# Patient Record
Sex: Male | Born: 1975 | Race: Black or African American | Hispanic: No | Marital: Married | Smoking: Never smoker
Health system: Southern US, Community
[De-identification: ages and names within clinical notes are randomized; demographics above are authoritative.]

---

## 2006-07-26 HISTORY — PX: VASECTOMY: SHX75

## 2007-07-27 ENCOUNTER — Emergency Department (HOSPITAL_COMMUNITY): Admission: EM | Admit: 2007-07-27 | Discharge: 2007-07-28 | Payer: Self-pay | Admitting: Emergency Medicine

## 2008-07-26 HISTORY — PX: OTHER SURGICAL HISTORY: SHX169

## 2009-11-19 ENCOUNTER — Ambulatory Visit: Payer: Self-pay | Admitting: Internal Medicine

## 2009-11-19 DIAGNOSIS — M549 Dorsalgia, unspecified: Secondary | ICD-10-CM | POA: Insufficient documentation

## 2009-11-19 DIAGNOSIS — IMO0002 Reserved for concepts with insufficient information to code with codable children: Secondary | ICD-10-CM

## 2009-11-20 ENCOUNTER — Encounter: Payer: Self-pay | Admitting: Internal Medicine

## 2009-11-21 ENCOUNTER — Telehealth: Payer: Self-pay | Admitting: Internal Medicine

## 2009-12-03 ENCOUNTER — Ambulatory Visit: Payer: Self-pay | Admitting: Internal Medicine

## 2010-08-25 NOTE — Letter (Signed)
Summary: Results Follow-up Letter  [facility]  [address] [place], [place] [address]   Phone: [phone]  Fax: [phone]    11/20/2009  [address] [place], [place]  [address]  Dear Mr. TIMBERMAN,   The following are the results of your recent test(s):  Test       Result     Xray of low back     mild arthritis   _________________________________________________________  Please call for an appointment as directed _________________________________________________________ _________________________________________________________ _________________________________________________________  Sincerely,  Sanda Linger MD [facility]

## 2010-08-25 NOTE — Assessment & Plan Note (Signed)
Summary: 2 wk f/u // #/cd   Vital Signs:  Patient profile:   35 year old male Height:      69 inches Weight:      240 pounds BMI:     35.57 O2 Sat:      98 % on Room air Temp:     99.0 degrees F oral Pulse rate:   68 / minute Pulse rhythm:   regular Resp:     16 per minute BP sitting:   118 / 74  (left arm) Cuff size:   large  Vitals Entered By: Rock Nephew CMA (Dec 03, 2009 9:23 AM)  Nutrition Counseling: Patient's BMI is greater than 25 and therefore counseled on weight management options.  O2 Flow:  Room air CC: follow-up visit// discuss medication and reaction, Back Pain Is Patient Diabetic? No   Primary Care Provider:  Etta Grandchild MD  CC:  follow-up visit// discuss medication and reaction and Back Pain.  History of Present Illness: He returns for f/up on LBP. It has improved some and is isolated to his LB with no radiation but he does have some "weird" numbness and tingling in the back of both thighs but none down to the feet. The request for an MRI was denied by his insurance company. He wants to try some PT. He has stopped taking Nucynta b/c it caused a rash. Naprosyn is controlling his pain.  Back Pain History:      He states this is not work related.  He states that he has no prior history of back pain.  The patient has not had any recent physical therapy for his back pain.    Critical Exclusionary Diagnosis Criteria (CEDC) for Back Pain:      The patient denies a history of previous trauma.  He has no prior history of spinal surgery.  There are no symptoms to suggest infection, cancer, cauda equina, or psychosocial factors for back pain.     Current Medications (verified): 1)  Naprosyn 375 Mg Tabs (Naproxen) .... One By Mouth Two Times A Day As Needed For Low Back Pain 2)  Nucynta 75 Mg Tabs (Tapentadol Hcl) .... One By Mouth Qid As Needed For Severe Pain 3)  Men Multivitamin  Allergies (verified): 1)  ! * Nucynta  Past History:  Past Medical  History: Reviewed history from 11/19/2009 and no changes required. Unremarkable  Past Surgical History: Reviewed history from 11/19/2009 and no changes required. Denies surgical history  Family History: Reviewed history from 11/19/2009 and no changes required. Family History of Alcoholism/Addiction Family History of Arthritis Family History Diabetes 1st degree relative Family History Hypertension  Social History: Reviewed history from 11/19/2009 and no changes required. Occupation: Public librarian Married Never Smoked Alcohol use-yes Drug use-no Regular exercise-yes  Review of Systems  The patient denies anorexia, fever, weight loss, chest pain, prolonged cough, headaches, hemoptysis, abdominal pain, incontinence, muscle weakness, and difficulty walking.    Physical Exam  General:  alert, well-developed, well-nourished, well-hydrated, appropriate dress, normal appearance, healthy-appearing, and cooperative to examination.   Mouth:  Oral mucosa and oropharynx without lesions or exudates.  Teeth in good repair. Neck:  No deformities, masses, or tenderness noted. Lungs:  Normal respiratory effort, chest expands symmetrically. Lungs are clear to auscultation, no crackles or wheezes. Heart:  Normal rate and regular rhythm. S1 and S2 normal without gallop, murmur, click, rub or other extra sounds. Abdomen:  Bowel sounds positive,abdomen soft and non-tender without masses, organomegaly or hernias noted. Msk:  normal ROM, no joint tenderness, no joint swelling, no joint warmth, no redness over joints, no joint deformities, no joint instability, no crepitation, and no muscle atrophy.   Pulses:  R and L carotid,radial,femoral,dorsalis pedis and posterior tibial pulses are full and equal bilaterally Extremities:  No clubbing, cyanosis, edema, or deformity noted with normal full range of motion of all joints.   Neurologic:  alert & oriented X3, cranial nerves II-XII intact, strength normal in  all extremities, and gait normal.   Skin:  turgor normal, color normal, no rashes, no suspicious lesions, no ecchymoses, no petechiae, no purpura, no ulcerations, and no edema.    Low Back Pain Physical Exam:    Inspection-deformity:     No    Palpation-spinal tenderness:   No    Motor Exam/Strength:         Left Ankle Dorsiflexion (L5,L4):     normal       Left Great Toe Dorsiflexion (L5,L4):     normal       Left Heel Walk (L5,some L4):     normal       Left Single Squat & Rise-Quads (L4):   normal       Left Toe Walk-calf (S1):       normal       Right Ankle Dorsiflexion (L5,L4):     normal       Right Great Toe Dorsiflexion (L5,L4):       normal       Right Heel Walk (L5,some L4):     normal       Right Single Squat & Rise Quads (L4):   normal       Right Toe Walk-calf (S1):       normal    Reflexes:        Left Knee Jerk (L4):     decreased       Left Ankle Reflex (S1):   decreased       Right Knee Jerk:     decreased       Right Ankle Reflex (S1):   decreased    Straight Leg Raise (SLR):       Left Straight Leg Raise (SLR):   negative       Right Straight Leg Raise (SLR):   negative   Impression & Recommendations:  Problem # 1:  LUMBAR RADICULOPATHY (ICD-724.4) Assessment Improved  The following medications were removed from the medication list:    Nucynta 75 Mg Tabs (Tapentadol hcl) ..... One by mouth qid as needed for severe pain His updated medication list for this problem includes:    Naprosyn 375 Mg Tabs (Naproxen) ..... One by mouth two times a day as needed for low back pain  Orders: Physical Therapy Referral (PT)  Problem # 2:  BACK PAIN (ICD-724.5) Assessment: Improved  The following medications were removed from the medication list:    Nucynta 75 Mg Tabs (Tapentadol hcl) ..... One by mouth qid as needed for severe pain His updated medication list for this problem includes:    Naprosyn 375 Mg Tabs (Naproxen) ..... One by mouth two times a day as needed for  low back pain  Orders: Physical Therapy Referral (PT)  Complete Medication List: 1)  Naprosyn 375 Mg Tabs (Naproxen) .... One by mouth two times a day as needed for low back pain 2)  Men Multivitamin   Patient Instructions: 1)  Please schedule a follow-up appointment in 2 months. 2)  Most patients (90%)  with low back pain will improve with time (2-6 weeks). Keep active but avoid activities that are painful. Apply moist heat and/or ice to lower back several times a day.

## 2010-08-25 NOTE — Assessment & Plan Note (Signed)
Summary: NEW/ [employer]/ BACK PROBLEM/NWS  #   Vital Signs:  Patient profile:   35 year old male Height:      69 inches Weight:      241.75 pounds BMI:     35.83 O2 Sat:      96 % on Room air Temp:     98.6 degrees F oral Pulse rate:   64 / minute Pulse rhythm:   regular Resp:     16 per minute BP sitting:   140 / 82  (left arm) Cuff size:   large  Vitals Entered By: Rock Nephew CMA (November 19, 2009 3:57 PM)  Nutrition Counseling: Patient's BMI is greater than 25 and therefore counseled on weight management options.  O2 Flow:  Room air CC: Low back pain w/ Right side foot numbness x 66month, Back pain, Back Pain Is Patient Diabetic? No Pain Assessment Patient in pain? yes     Location: back   Primary Care Provider:  Etta Grandchild MD  CC:  Low back pain w/ Right side foot numbness x 66month, Back pain, and Back Pain.  History of Present Illness:  Back Pain      This is a 35 year old man who presents with Back pain.  The symptoms began 4-8 weeks ago.  The intensity is described as moderate.  The patient denies fever, chills, weakness, loss of sensation, fecal incontinence, urinary incontinence, urinary retention, dysuria, rest pain, inability to work, and inability to care for self.  The pain is located in the mid low back.  The pain began suddenly.  The pain radiates to the right leg below the knee and left buttock.  Risk factors for serious underlying conditions include duration of pain > 1 month.     Critical Exclusionary Diagnosis Criteria (CEDC) for Back Pain:      The patient denies a history of previous trauma.  He notes a prior history of spinal surgery.  There are no symptoms to suggest infection, cauda equina, or psychosocial factors for back pain.  Cancer risk factors include no improvement in low back pain after 4-6 weeks therapy.  Other positive CEDC factors include progressive neurologic symptoms.     Preventive Screening-Counseling &  Management  Alcohol-Tobacco     Alcohol drinks/day: <1     Alcohol type: beer     >5/day in last 3 mos: no     Alcohol Counseling: not indicated; use of alcohol is not excessive or problematic     Feels need to cut down: no     Feels annoyed by complaints: no     Feels guilty re: drinking: no     Needs 'eye opener' in am: no     Smoking Status: never  Caffeine-Diet-Exercise     Does Patient Exercise: yes  Hep-HIV-STD-Contraception     Hepatitis Risk: no risk noted     HIV Risk: no risk noted     STD Risk: no risk noted     TSE monthly: yes     Testicular SE Education/Counseling to perform regular STE      Sexual History:  currently monogamous.        Drug Use:  no.        Blood Transfusions:  no.    Medications Prior to Update: 1)  None  Current Medications (verified): 1)  Naprosyn 375 Mg Tabs (Naproxen) .... One By Mouth Two Times A Day As Needed For Low Back Pain 2)  Nucynta 75 Mg  Tabs (Tapentadol Hcl) .... 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  The patient denies anorexia, fever, weight loss, chest pain, syncope, dyspnea on exertion, peripheral edema, prolonged cough, headaches, hemoptysis, abdominal pain, hematuria, incontinence, muscle weakness, suspicious  skin lesions, and enlarged lymph nodes.    Physical Exam  General:  alert, well-developed, well-nourished, well-hydrated, appropriate dress, normal appearance, healthy-appearing, and cooperative to examination.   Head:  normocephalic, atraumatic, no abnormalities observed, and no abnormalities palpated.   Eyes:  vision grossly intact, pupils equal, pupils round, and pupils reactive to light.   Mouth:  Oral mucosa and oropharynx without lesions or exudates.  Teeth in good repair. Neck:  No deformities, masses, or tenderness noted. Lungs:  Normal respiratory effort, chest expands symmetrically. Lungs are clear to auscultation, no crackles or wheezes. Heart:  Normal rate and regular rhythm. S1 and S2 normal without gallop, murmur, click, rub or other extra sounds. Abdomen:  Bowel sounds positive,abdomen soft and non-tender without masses, organomegaly or hernias noted. Msk:  normal ROM, no joint tenderness, no joint swelling, no joint warmth, no redness over joints, no joint deformities, no joint instability, no crepitation, and no muscle atrophy.   Pulses:  R and L carotid,radial,femoral,dorsalis pedis and posterior tibial pulses are full and equal bilaterally Extremities:  No clubbing, cyanosis, edema, or deformity noted with normal full range of motion of all joints.   Neurologic:  alert & oriented X3, cranial nerves II-XII intact, strength normal in all extremities, and gait normal.   Skin:  turgor normal, color normal, no rashes, no suspicious lesions, no ecchymoses, no petechiae, no purpura, no ulcerations, and no edema.   Cervical Nodes:  no anterior cervical adenopathy and no posterior cervical adenopathy.   Axillary Nodes:  no R axillary adenopathy and no L axillary adenopathy.   Inguinal Nodes:  no R inguinal adenopathy and no L inguinal adenopathy.    Low Back Pain Physical Exam:    Inspection-deformity:     No    Palpation-spinal tenderness:   No    Motor Exam/Strength:         Left  Ankle Dorsiflexion (L5,L4):     normal       Left Great Toe Dorsiflexion (L5,L4):     normal       Left Heel Walk (L5,some L4):     normal       Left Single Squat & Rise-Quads (L4):   normal       Left Toe Walk-calf (S1):       normal       Right Ankle Dorsiflexion (L5,L4):     normal       Right Great Toe Dorsiflexion (L5,L4):       normal       Right Heel Walk (L5,some L4):     normal  Right Single Squat & Rise Quads (L4):   normal       Right Toe Walk-calf (S1):       normal    Sensory Exam/Pinprick:        Left Medial Foot (L4):   normal       Left Dorsal Foot (L5):   normal       Right Medial Foot (L4):   normal       Right Dorsal Foot (L5):   normal       Right Lateral Foot (S1):   normal    Reflexes:        Left Knee Jerk (L4):     decreased       Left Ankle Reflex (S1):   decreased       Right Knee Jerk:     decreased       Right Ankle Reflex (S1):   decreased    Straight Leg Raise (SLR):       Left Straight Leg Raise (SLR):   positive at 10 degrees       Right Straight Leg Raise (SLR):   positive at 5 degrees   Impression & Recommendations:  Problem # 1:  LUMBAR RADICULOPATHY (ICD-724.4) Assessment New  His updated medication list for this problem includes:    Naprosyn 375 Mg Tabs (Naproxen) ..... One by mouth two times a day as needed for low back pain    Nucynta 75 Mg Tabs (Tapentadol hcl) ..... One by mouth qid as needed for severe pain  Orders: Radiology Referral (Radiology)  Discussed use of moist heat or ice, modified activities, medications, and stretching/strengthening exercises. Back care instructions given. To be seen in 2 weeks if no improvement; sooner if worsening of symptoms.   Problem # 2:  BACK PAIN (ICD-724.5) Assessment: New  His updated medication list for this problem includes:    Naprosyn 375 Mg Tabs (Naproxen) ..... One by mouth two times a day as needed for low back pain    Nucynta 75 Mg Tabs (Tapentadol hcl) ..... One by mouth qid as  needed for severe pain  Orders: T-Lumbar Spine Complete, 5 Views [phone])  Discussed use of moist heat or ice, modified activities, medications, and stretching/strengthening exercises. Back care instructions given. To be seen in 2 weeks if no improvement; sooner if worsening of symptoms.   Complete Medication List: 1)  Naprosyn 375 Mg Tabs (Naproxen) .... One by mouth two times a day as needed for low back pain 2)  Nucynta 75 Mg Tabs (Tapentadol hcl) .... One by mouth qid as needed for severe pain  Indications for MRI of L/S Spine Based on Back Pain History and Exam: 1)   no improvement in LBP after 4-6 wks Rx 2)   Sciatica (pain radiates below knee) 3)   progressive neurologic sx's  Patient Instructions: 1)  Please schedule a follow-up appointment in 2 weeks. 2)  Take 650-1000mg  of Tylenol every 4-6 hours as needed for relief of pain or comfort of fever AVOID taking more than 4000mg   in a 24 hour period (can cause liver damage in higher doses). 3)  Most patients (90%) with low back pain will improve with time (2-6 weeks). Keep active but avoid activities that are painful. Apply moist heat and/or ice to lower back several times a day. Prescriptions: NUCYNTA 75 MG TABS (TAPENTADOL HCL) One by mouth QID as needed for severe pain  #75 x 0   Entered and Authorized by:   Maisie Fus  Karsten Ro MD   Signed by:   Etta Grandchild MD on 11/19/2009   Method used:   Print then Give to Patient   RxID:   [id number] NAPROSYN 375 MG TABS (NAPROXEN) One by mouth two times a day as needed for low back pain  #60 x 1   Entered and Authorized by:   Etta Grandchild MD   Signed by:   Etta Grandchild MD on 11/19/2009   Method used:   Print then Give to Patient   RxID:   [id number]

## 2010-08-25 NOTE — Progress Notes (Signed)
     Follow-up for Phone Call       Follow-up by: Etta Grandchild MD,  November 21, 2009 11:36 AM

## 2013-08-02 ENCOUNTER — Emergency Department (INDEPENDENT_AMBULATORY_CARE_PROVIDER_SITE_OTHER)
Admission: EM | Admit: 2013-08-02 | Discharge: 2013-08-02 | Disposition: A | Discharge status: Home / Self Care | Source: Home / Self Care

## 2013-08-02 ENCOUNTER — Encounter (HOSPITAL_COMMUNITY): Payer: Self-pay | Admitting: Emergency Medicine

## 2013-08-02 DIAGNOSIS — M778 Other enthesopathies, not elsewhere classified: Secondary | ICD-10-CM

## 2013-08-02 DIAGNOSIS — M779 Enthesopathy, unspecified: Principal | ICD-10-CM

## 2013-08-02 DIAGNOSIS — M65839 Other synovitis and tenosynovitis, unspecified forearm: Secondary | ICD-10-CM

## 2013-08-02 DIAGNOSIS — M65849 Other synovitis and tenosynovitis, unspecified hand: Secondary | ICD-10-CM

## 2013-08-02 NOTE — ED Provider Notes (Signed)
Medical screening examination/treatment/procedure(s) were performed by non-physician practitioner and as supervising physician I was immediately available for consultation/collaboration.  Kadajah Kjos, M.D.  Frank Pilger C Sanaiyah Kirchhoff, MD 08/02/13 1530 

## 2013-08-02 NOTE — ED Notes (Signed)
Pt  Reports   Pain  r  Thumb    X  4  Days          denys  Any injury         He              Reports  Certain       Movements  And  posistions  Cause  Pain         -  He denys  Any other     Symptoms  He  Is  Awake  And  Alert  And  Oriented

## 2013-08-02 NOTE — Discharge Instructions (Signed)
Extensor Pollicis Longus Tendinitis °Extensor pollicis longus (EPL) tendonitis is a condition in which the EPL tendon lining (sheath) becomes inflamed. This causes pain on the thumb side (radial side) of the back of the wrist. The EPL tendon attaches the EPL muscle to the bone. The EPL muscle straightens the thumb. The tendon lining secretes a lubricant that allows the EPL to glide smoothly. When the lining becomes inflamed, the tendon can not glide smoothly, which causes pain. There are three grades of EPL tendonitis. A grade 1 strain is a mild strain, where the tendon experiences a slight pull, but no obvious tearing (microscopic tearing). There is no loss of strength, and the tendon is the correct length. Grade 2 strains are a moderate strain. There is tearing of tendon fibers within the tendon or where the tendon meets the bone or muscle. Tearing of the tendon causes the length of the whole muscle-tendon-bone unit to increase. A grade 2 injury usually results in decreased strength. A grade 3 strain is a complete rupture of the tendon.  °CAUSES  °· EPL tendinitis is often an overuse injury and caused by repeated motions of the hand and wrist, due to friction of the tendon within the lining. °· Sudden increase in activity or change in activity. °SYMPTOMS  °· Vague, diffuse pain and tenderness over the thumb side of the back of the wrist. °· Pain that gets worse when straightening the thumb. °· Locking, catching, or triggering of the thumb joint. °· Limited motion of the thumb. °· Little or no swelling, warmth, or redness of the back of the wrist. °· Crackling sound (crepitation) when the tendon or wrist is moved or touched. °RISK INCREASES WITH: °· Sports that involve repeated hand and wrist motions (tennis, golf, bowling). °· Previous wrist injury. °· Heavy labor. °· Poor wrist strength and flexibility. °· Failure to warm up properly before activity. °PREVENTION  °· Warm up and stretch properly before  activity. °· Allow time for adequate rest and recovery between practices and competition. °· Maintain physical fitness: °· Forearm, wrist, and hand flexibility. °· Muscle strength and endurance. °· Learn and use proper exercise technique. °PROGNOSIS  °This condition is often curable within 6 weeks, if treated properly with non-surgical treatment and resting the affected area. Surgery may be advised. °RELATED COMPLICATIONS  °· Longer healing time, if not properly treated, or if not given adequate time to heal. °· Chronic inflammation of the EPL tendon, causing pain. °· Recurring of symptoms, especially if activity is resumed too soon. °· Risks of surgery: infection, bleeding, injury to nerves (numbness of the thumb), continued pain, incomplete release of the tendon lining, recurring symptoms, cutting of the tendon, thumb weakness, thumb stiffness, and inability to straighten the thumb. °TREATMENT  °Treatment first involves ice and medicine to reduce pain and inflammation. Stretching and strengthening exercises, along with activity modification, may be advised to reduce painful symptoms. For certain cases, a brace, splint, or taping may be advised, to reduce wrist movement during activity. Your caregiver may recommend a corticosteroid injection to reduce inflammation. If non-surgical treatment is unsuccessful, surgery may be advised to release the inflamed tendon. If the tendon is torn, it may require repair or replacement (reconstruction). °MEDICATION  °· If pain medicine is needed, nonsteroidal anti-inflammatory medicines, (aspirin and ibuprofen), or other minor pain relievers (acetaminophen), are often advised. °· Do not take pain medicine for 7 days before surgery. °· Prescription pain relievers are usually only prescribed after surgery. Use only as directed and only   as much as you need. °· Cortisone injections reduce inflammation. However, these are used only in extreme cases, because there is a limit to the  number of times they may be given. These injections may weaken muscle and tendon tissue. Anesthetics also temporarily relieve pain. °COLD THERAPY  °Cold treatment (icing) relieves pain and reduces inflammation. Cold treatment should be applied for 10 to 15 minutes every 2 to 3 hours, and immediately after activity that aggravates your symptoms. Use ice packs or an ice massage. °SEEK MEDICAL CARE IF:  °· Symptoms get worse or do not improve in 2 to 4 weeks, despite treatment. °· You experience pain, numbness, or coldness in the hand. °· Blue, gray, or dark color appears in the fingernails. °· Any of the following occur after surgery: increased pain, swelling, redness, drainage of fluids, bleeding in the affected area, or signs of infection. °· New, unexplained symptoms develop. (Drugs used in treatment may produce side effects.) °Document Released: 07/12/2005 Document Revised: 10/04/2011 Document Reviewed: 10/24/2008 °[employer]® Patient Information ©2014 [employer]. ° °Tendinitis °Tendinitis is swelling and inflammation of the tendons. Tendons are band-like tissues that connect muscle to bone. Tendinitis commonly occurs in the:  °· Shoulders (rotator cuff). °· Heels (Achilles tendon). °· Elbows (triceps tendon). °CAUSES °Tendinitis is usually caused by overusing the tendon, muscles, and joints involved. When the tissue surrounding a tendon (synovium) becomes inflamed, it is called tenosynovitis. Tendinitis commonly develops in people whose jobs require repetitive motions. °SYMPTOMS °· Pain. °· Tenderness. °· Mild swelling. °DIAGNOSIS °Tendinitis is usually diagnosed by physical exam. Your caregiver may also order X-rays or other imaging tests. °TREATMENT °Your caregiver may recommend certain medicines or exercises for your treatment. °HOME CARE INSTRUCTIONS  °· Use a sling or splint for as long as directed by your caregiver until the pain decreases. °· Put ice on the injured area. °· Put ice in a plastic  bag. °· Place a towel between your skin and the bag. °· Leave the ice on for 15-20 minutes, 03-04 times a day. °· Avoid using the limb while the tendon is painful. Perform gentle range of motion exercises only as directed by your caregiver. Stop exercises if pain or discomfort increase, unless directed otherwise by your caregiver. °· Only take over-the-counter or prescription medicines for pain, discomfort, or fever as directed by your caregiver. °SEEK MEDICAL CARE IF:  °· Your pain and swelling increase. °· You develop new, unexplained symptoms, especially increased numbness in the hands. °MAKE SURE YOU:  °· Understand these instructions. °· Will watch your condition. °· Will get help right away if you are not doing well or get worse. °Document Released: 07/09/2000 Document Revised: 10/04/2011 Document Reviewed: 09/28/2010 °[employer]® Patient Information ©2014 [employer]. ° °Repetitive Strain Injuries °Repetitive strain injuries (RSIs) result from overuse or misuse of soft tissues including muscles, tendons, or nerves. Tendons are the cord-like structures that attach muscles to bones. RSIs can affect almost any part of the body. However, RSIs are most common in the arms (thumbs, wrists, elbows, shoulders) and legs (ankles, knees). Common medical conditions that are often caused by repetitive strain include carpal tunnel syndrome, tennis or golfer's elbow, bursitis, and tendonitis. If RSIs are treated early, and the repeated activity is reduced or removed, the severity and length of your problems can usually be reduced. RSIs are also called cumulative trauma disorders (CTD).  °CAUSES  °Many RSIs occur due to repeating the same activity at work over weeks or months without sufficient rest, such as prolonged typing.   RSIs also commonly occur when a hobby or sport is done repeatedly without sufficient rest. RSIs can also occur due to repeated strain or stress on a body part in someone who has one or more risk factors  for RSIs. °RISK FACTORS °Workplace risk factors °· Frequent computer use, especially if your workstation is not adjusted for your body type. °· Infrequent rest breaks. °· Working in a high-pressure environment. °· Working at a fast pace. °· Repeating the same motion, such as frequent typing. °· Working in an awkward position or holding the same position for a long time. °· Forceful movements such as lifting, pulling, or pushing. °· Vibration caused by using power tools. °· Working in cold temperatures. °· Job stress. °Personal risk factors °· Poor posture. °· Being loose-jointed. °· Not exercising regularly. °· Being overweight. °· Arthritis, diabetes, thyroid problems, or other long-term (chronic) medical conditions. °· Vitamin deficiencies. °· Keeping your fingernails long. °· An unhealthy, stressful, or inactive lifestyle. °· Not sleeping well. °SYMPTOMS  °Symptoms often begin at work but become more noticeable after the repeated stress has ended. For example, you may develop fatigue or soreness in your  wrist while typing at work, and at night you may develop numbness and tingling in your fingers. Common symptoms include:  °· Burning, shooting, or aching pain, especially in the fingers, palms, wrists, forearms, or shoulders. °· Tenderness. °· Swelling. °· Tingling, numbness, or loss of feeling. °· Pain with certain activities, such as turning a doorknob or reaching above your head. °· Weakness, heaviness, or loss of coordination in your hand. °· Muscle spasms or tightness. °In some cases, symptoms can become so intense that it is difficult to perform everyday tasks. Symptoms that do not improve with rest may indicate a more serious condition.  °DIAGNOSIS  °Your caregiver may determine the type of RSI you have based on your medical evaluation and a description of your activities.  °TREATMENT  °Treatment depends on the severity and type of RSI you have. Your caregiver may recommend rest for the affected body part,  medicines, and physical or occupational therapy to reduce pain, swelling, and soreness. Discuss the activities you do repeatedly with your caregiver. Your caregiver can help you decide whether you need to change your activities. An RSI may take months or years to heal, especially if the affected body part gets insufficient rest. In some cases, such as severe carpal tunnel syndrome, surgery may be recommended. °PREVENTION °· Talk with your supervisor to make sure you have the proper equipment for your work station. °· Maintain good posture at your desk or work station with: °· Feet flat on the floor. °· Knees directly over the feet, bent at a right angle. °· Lower back supported by your chair or a cushion in the curve of your lower back. °· Shoulders and arms relaxed and at your sides. °· Neck relaxed and not bent forwards or backwards. °· Your desk and computer workstation properly adjusted to your body type. °· Your chair adjusted so there is no excess pressure on the back of your thighs. °· The keyboard resting above your thighs. You should be able to reach the keys with your elbows at your side, bent at a right angle. Your arms should be supported on forearm rests, with your forearms parallel to the ground. °· The computer mouse within easy reach. °· The monitor directly in front of you, so that your eyes are aligned with the top of the screen. The screen should be about 15 to 25 inches   from your eyes. °· While typing, keep your wrist straight, in a neutral position. Move your entire arm when you move your mouse or when typing hard-to-reach keys. °· Only use your computer as much as you need to for work. Do not use it during breaks. °· Take breaks often from any repeated activity. Alternate with another task which requires you to use different muscles, or rest at least once every hour. °· Change positions regularly. If you spend a lot of time sitting, get up, walk around, and stretch. °· Do not hold pens or  pencils tightly when writing. °· Exercise regularly. °· Maintain a normal weight. °· Eat a diet with plenty of vegetables, whole grains, and fruit. °· Get sufficient, restful sleep. °HOME CARE INSTRUCTIONS °· If your caregiver prescribed medicine to help reduce swelling, take it as directed. °· Only take over-the-counter or prescription medicines for pain, discomfort, or fever as directed by your caregiver. °· Reduce, and if needed, stop the activities that are causing your problems until you have no further symptoms. If your symptoms are work-related, you may need to talk to your supervisor about changing your activities. °· When symptoms develop, put ice or a cold pack on the aching area. °· Put ice in a plastic bag. °· Place a towel between your skin and the bag. °· Leave the ice on for 15-20 minutes. °· If you were given a splint to keep your wrist from bending, wear it as instructed. It is important to wear the splint at night. Use the splint for as long as your caregiver recommends. °SEEK MEDICAL CARE IF: °· You develop new problems. °· Your problems do not get better with medicine. °MAKE SURE YOU: °· Understand these instructions. °· Will watch your condition. °· Will get help right away if you are not doing well or get worse. °Document Released: 07/02/2002 Document Revised: 01/11/2012 Document Reviewed: 09/02/2011 °[employer]® Patient Information ©2014 [employer]. ° °

## 2013-08-02 NOTE — ED Provider Notes (Signed)
CSN: [id number]     Arrival date & time 08/02/13  0840 History   First MD Initiated Contact with Patient 08/02/13 [phone]     Chief Complaint  Patient presents with  . Hand Problem   (Consider location/radiation/quality/duration/timing/severity/associated sxs/prior Treatment) HPI Comments: Pleasant 38 year old male is complaining of right thumb pain. Pain is located at the base of the thumb and the thenar eminence. He has a weak grip with pain primarily at the MCP and thenar eminence. He denies any known injury. 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 No known trauma.   History reviewed. No pertinent past medical history. History reviewed. No pertinent past surgical history. History reviewed. No pertinent family history. History  Substance Use Topics  . Smoking status: Never Smoker   . Smokeless tobacco: Not on file  . Alcohol Use: No    Review of Systems  Constitutional: Negative.   Respiratory: Negative.   Gastrointestinal: Negative.   Genitourinary: Negative.   Musculoskeletal: Negative for back pain, joint swelling and myalgias.       As per HPI  Skin: Negative.  Negative for color change, pallor and rash.  Neurological: Negative for dizziness, weakness, numbness and headaches.    Allergies  Review of patient's allergies indicates no known allergies.  Home Medications  No current outpatient prescriptions on file. There were no vitals taken for this visit. Physical Exam  Nursing note and vitals reviewed. Constitutional: He is oriented to person, place, and time. He appears well-developed and well-nourished.  HENT:  Head: Normocephalic and atraumatic.  Eyes: EOM are normal.  Neck: Neck supple.  Cardiovascular: Normal rate.   Pulmonary/Chest: Effort normal. No respiratory distress.  Musculoskeletal: Normal range of motion. He exhibits tenderness. He exhibits no edema.  No apparent swelling, deformity or discoloration. Tenderness at the base of the  thumb along the extensor pollicis longus and the distal thenar eminence. Opposition creates pain in these areas as well as forced grasp. Distal neurovascular motor sensory is intact. Capillary refill less than 2 seconds, radial pulse 2+. Positive Finkelstein.  Neurological: He is alert and oriented to person, place, and time. No cranial nerve deficit.  Skin: Skin is warm and dry.  Psychiatric: He has a normal mood and affect.    ED Course  Procedures (including critical care time) Labs Review Labs Reviewed - No data to display Imaging Review No results found.    MDM   1. Tendinitis of thumb     Most likely this is a repetitive strain injury associated with his weightlifting. Splint right thumb in position of function and limit grasping for at least a week. Apply ice periodically to areas of soreness and pain. Recommend altering the workout, lifting weights said that there is no requirement for flexion of the right thumb.  Hayden Rasmussenavid Jeanita Carneiro, NP 08/02/13 [phone]  Hayden Rasmussenavid Reece Mcbroom, NP 08/02/13 [phone]  Hayden Rasmussenavid Cleveland Yarbro, NP 08/02/13 [phone]

## 2018-04-03 DIAGNOSIS — Z0000 Encounter for general adult medical examination without abnormal findings: Secondary | ICD-10-CM | POA: Diagnosis not present

## 2018-04-03 DIAGNOSIS — R1012 Left upper quadrant pain: Secondary | ICD-10-CM

## 2018-04-03 DIAGNOSIS — Z8371 Family history of colonic polyps: Secondary | ICD-10-CM

## 2018-04-03 DIAGNOSIS — Z23 Encounter for immunization: Secondary | ICD-10-CM | POA: Diagnosis not present

## 2018-05-01 ENCOUNTER — Encounter (HOSPITAL_COMMUNITY): Payer: Self-pay

## 2018-05-01 ENCOUNTER — Other Ambulatory Visit: Payer: Self-pay

## 2018-05-01 ENCOUNTER — Emergency Department (HOSPITAL_COMMUNITY)
Admission: EM | Admit: 2018-05-01 | Discharge: 2018-05-01 | Disposition: A | Discharge status: Home / Self Care | Payer: BC Managed Care – PPO | Attending: Emergency Medicine | Admitting: Emergency Medicine

## 2018-05-01 ENCOUNTER — Emergency Department (HOSPITAL_COMMUNITY): Payer: BC Managed Care – PPO

## 2018-05-01 DIAGNOSIS — K5900 Constipation, unspecified: Secondary | ICD-10-CM | POA: Insufficient documentation

## 2018-05-01 DIAGNOSIS — R1031 Right lower quadrant pain: Secondary | ICD-10-CM | POA: Diagnosis present

## 2018-05-01 LAB — URINALYSIS, ROUTINE W REFLEX MICROSCOPIC
BILIRUBIN URINE: NEGATIVE
Glucose, UA: NEGATIVE mg/dL
Hgb urine dipstick: NEGATIVE
Ketones, ur: NEGATIVE mg/dL
Leukocytes, UA: NEGATIVE
NITRITE: NEGATIVE
Protein, ur: NEGATIVE mg/dL
SPECIFIC GRAVITY, URINE: 1.027 (ref 1.005–1.030)
pH: 5 (ref 5.0–8.0)

## 2018-05-01 LAB — CBC WITH DIFFERENTIAL/PLATELET
BASOS ABS: 0.1 10*3/uL (ref 0.0–0.1)
Basophils Relative: 1 %
EOS ABS: 0.4 10*3/uL (ref 0.0–0.7)
EOS PCT: 5 %
HCT: 48 % (ref 39.0–52.0)
HEMOGLOBIN: 16.5 g/dL (ref 13.0–17.0)
LYMPHS PCT: 31 %
Lymphs Abs: 2.6 10*3/uL (ref 0.7–4.0)
MCH: 30.8 pg (ref 26.0–34.0)
MCHC: 34.4 g/dL (ref 30.0–36.0)
MCV: 89.6 fL (ref 78.0–100.0)
Monocytes Absolute: 0.7 10*3/uL (ref 0.1–1.0)
Monocytes Relative: 9 %
NEUTROS PCT: 54 %
Neutro Abs: 4.6 10*3/uL (ref 1.7–7.7)
PLATELETS: 229 10*3/uL (ref 150–400)
RBC: 5.36 MIL/uL (ref 4.22–5.81)
RDW: 12.7 % (ref 11.5–15.5)
WBC: 8.3 10*3/uL (ref 4.0–10.5)

## 2018-05-01 LAB — I-STAT CHEM 8, ED
BUN: 10 mg/dL (ref 6–20)
CHLORIDE: 102 mmol/L (ref 98–111)
Calcium, Ion: 1.17 mmol/L (ref 1.15–1.40)
Creatinine, Ser: 1 mg/dL (ref 0.61–1.24)
Glucose, Bld: 107 mg/dL — ABNORMAL HIGH (ref 70–99)
HCT: 48 % (ref 39.0–52.0)
Hemoglobin: 16.3 g/dL (ref 13.0–17.0)
POTASSIUM: 3.7 mmol/L (ref 3.5–5.1)
SODIUM: 139 mmol/L (ref 135–145)
TCO2: 26 mmol/L (ref 22–32)

## 2018-05-01 MED ORDER — ACETAMINOPHEN 500 MG PO TABS
1000.0000 mg | ORAL_TABLET | Freq: Once | ORAL | Status: AC
  Administered 2018-05-01: 1000 mg via ORAL
  Filled 2018-05-01: qty 2

## 2018-05-01 MED ORDER — KETOROLAC TROMETHAMINE 30 MG/ML IJ SOLN
15.0000 mg | Freq: Once | INTRAMUSCULAR | Status: AC
  Administered 2018-05-01: 15 mg via INTRAVENOUS
  Filled 2018-05-01: qty 1

## 2018-05-01 NOTE — ED Notes (Addendum)
Patient transported to CT. Will obtain labs once patient returns.

## 2018-05-01 NOTE — ED Notes (Signed)
Bladder scan volume: 0mL  MD and Primary RN aware.

## 2018-05-01 NOTE — ED Triage Notes (Addendum)
Pt reports R hip pain that radiates down his leg that woke him up from his sleep about an our ago. He states that the pain radiates down his leg when putting pressure on it. Denies injury or history of pain in that area. A&Ox4. Ambulatory with a limp. He states, "If I had some crutches, I think I will be better."

## 2018-05-01 NOTE — ED Notes (Signed)
RN stated to NT that they would start an IV and obtain labs at that time.

## 2018-05-01 NOTE — ED Provider Notes (Signed)
[facility]-EMERGENCY DEPT Provider Note   CSN: [id number] Arrival date & time: 05/01/18  0409     History   Chief Complaint Chief Complaint  Patient presents with  . Hip Pain    HPI Alfred Taylor is a 42 y.o. male.  The history is provided by the patient. No language interpreter was used.  Abdominal Pain   This is a new problem. The current episode started 1 to 2 hours ago. The problem occurs constantly. The problem has not changed since onset.The pain is associated with an unknown factor. The pain is located in the RLQ. The quality of the pain is aching and sharp. The pain is at a severity of 8/10. The pain is severe. Pertinent negatives include anorexia, fever, belching, flatus, hematochezia, melena, nausea, constipation, dysuria, hematuria, headaches, arthralgias and myalgias. Nothing aggravates the symptoms. Nothing relieves the symptoms. Past workup does not include GI consult. His past medical history does not include PUD or ulcerative colitis.  Patient originally stated it was right hip pain but now is stating that moving the hip makes the abdominal pain worse.  No back pain.  No n/v/d.  No f/c/r.    History reviewed. No pertinent past medical history.  Patient Active Problem List   Diagnosis Date Noted  . 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 BACK PAIN 11/19/2009    History reviewed. No pertinent surgical history.      Home Medications    Prior to Admission medications   Not on File    Family History History reviewed. No pertinent family history.  Social History Social History   Tobacco Use  . Smoking status: Never Smoker  Substance Use Topics  . Alcohol use: No  . Drug use: Not on file     Allergies   Patient has no known allergies.   Review of Systems Review of Systems  Constitutional: Negative for fever.  HENT: Negative for congestion.   Eyes: Negative for visual disturbance.  Respiratory: Negative for cough and shortness  of breath.   Cardiovascular: Negative for chest pain.  Gastrointestinal: Positive for abdominal pain. Negative for anorexia, blood in stool, constipation, flatus, hematochezia, melena and nausea.  Genitourinary: Negative for dysuria and hematuria.  Musculoskeletal: Negative for arthralgias and myalgias.  Neurological: Negative for headaches.  All other systems reviewed and are negative.    Physical Exam Updated Vital Signs BP 137/66 (BP Location: Right Arm)   Pulse 63   Temp 98.7 F (37.1 C) (Oral)   Resp 18   Ht 5\' 8"  (1.727 m)   Wt 111.1 kg   SpO2 99%   BMI 37.25 kg/m   Physical Exam  Constitutional: He is oriented to person, place, and time. He appears well-developed and well-nourished. No distress.  HENT:  Head: Normocephalic and atraumatic.  Mouth/Throat: No oropharyngeal exudate.  Eyes: Pupils are equal, round, and reactive to light. Conjunctivae are normal.  Neck: Normal range of motion. Neck supple.  Cardiovascular: Normal rate, regular rhythm, normal heart sounds and intact distal pulses.  Pulmonary/Chest: Effort normal and breath sounds normal. No stridor. He has no wheezes. He has no rales.  Abdominal: Soft. Bowel sounds are normal. He exhibits no mass. There is no tenderness. There is no rebound and no guarding.  Musculoskeletal: Normal range of motion.  Neurological: He is alert and oriented to person, place, and time. He displays normal reflexes.  Skin: Skin is warm and dry. Capillary refill takes less than 2 seconds.  Psychiatric: He has a normal  mood and affect.  Nursing note and vitals reviewed.    ED Treatments / Results  Labs (all labs ordered are listed, but only abnormal results are displayed) Results for orders placed or performed during the hospital encounter of 05/01/18  CBC with Differential/Platelet  Result Value Ref Range   WBC 8.3 4.0 - 10.5 K/uL   RBC 5.36 4.22 - 5.81 MIL/uL   Hemoglobin 16.5 13.0 - 17.0 g/dL   HCT 16.1 09.6 - 04.5 %    MCV 89.6 78.0 - 100.0 fL   MCH 30.8 26.0 - 34.0 pg   MCHC 34.4 30.0 - 36.0 g/dL   RDW 40.9 81.1 - 91.4 %   Platelets 229 150 - 400 K/uL   Neutrophils Relative % 54 %   Neutro Abs 4.6 1.7 - 7.7 K/uL   Lymphocytes Relative 31 %   Lymphs Abs 2.6 0.7 - 4.0 K/uL   Monocytes Relative 9 %   Monocytes Absolute 0.7 0.1 - 1.0 K/uL   Eosinophils Relative 5 %   Eosinophils Absolute 0.4 0.0 - 0.7 K/uL   Basophils Relative 1 %   Basophils Absolute 0.1 0.0 - 0.1 K/uL  Urinalysis, Routine w reflex microscopic  Result Value Ref Range   Color, Urine YELLOW YELLOW   APPearance CLEAR CLEAR   Specific Gravity, Urine 1.027 1.005 - 1.030   pH 5.0 5.0 - 8.0   Glucose, UA NEGATIVE NEGATIVE mg/dL   Hgb urine dipstick NEGATIVE NEGATIVE   Bilirubin Urine NEGATIVE NEGATIVE   Ketones, ur NEGATIVE NEGATIVE mg/dL   Protein, ur NEGATIVE NEGATIVE mg/dL   Nitrite NEGATIVE NEGATIVE   Leukocytes, UA NEGATIVE NEGATIVE  I-Stat Chem 8, ED  Result Value Ref Range   Sodium 139 135 - 145 mmol/L   Potassium 3.7 3.5 - 5.1 mmol/L   Chloride 102 98 - 111 mmol/L   BUN 10 6 - 20 mg/dL   Creatinine, Ser 7.82 0.61 - 1.24 mg/dL   Glucose, Bld 956 (H) 70 - 99 mg/dL   Calcium, Ion 2.13 0.86 - 1.40 mmol/L   TCO2 26 22 - 32 mmol/L   Hemoglobin 16.3 13.0 - 17.0 g/dL   HCT 57.8 46.9 - 62.9 %   Ct Renal Stone Study  Result Date: 05/01/2018 CLINICAL DATA:  Right groin pain. EXAM: CT ABDOMEN AND PELVIS WITHOUT CONTRAST TECHNIQUE: Multidetector CT imaging of the abdomen and pelvis was performed following the standard protocol without IV contrast. COMPARISON:  None. FINDINGS: Lower chest: The lung bases are clear. Hepatobiliary: No focal liver abnormality is seen. No gallstones, gallbladder wall thickening, or biliary dilatation. Pancreas: No ductal dilatation or inflammation. Spleen: Normal in size without focal abnormality. Adrenals/Urinary Tract: Normal adrenal glands. No renal stones or hydronephrosis. No perinephric edema. 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 Both ureters are decompressed without stones along the course. Urinary bladder is nondistended, no bladder stone. Stomach/Bowel: Stomach is within normal limits. Appendix is normal, for example image 56 series 2. No evidence of bowel wall thickening, distention, or inflammatory changes. Large colonic stool burden in the ascending, transverse, descending and sigmoid colon. There is sigmoid colonic tortuosity. Vascular/Lymphatic: No adenopathy. Unremarkable appearance of noncontrast vascular structures. Reproductive: Prostate is unremarkable. Other: No free air, free fluid, or intra-abdominal fluid collection. No abdominal wall hernia. Musculoskeletal: Multiple degenerative Schmorl's nodes in the lumbar spine. Degenerative change of the left greater than right hip. There are no acute or suspicious osseous abnormalities. IMPRESSION: 1. No renal stones or obstructive uropathy. 2. Moderate stool burden  throughout the colon with sigmoid colonic tortuosity, suggesting constipation. Electronically Signed   By: Narda Rutherford M.D.   On: 05/01/2018 05:19    Radiology Ct Renal Stone Study  Result Date: 05/01/2018 CLINICAL DATA:  Right groin pain. EXAM: CT ABDOMEN AND PELVIS WITHOUT CONTRAST TECHNIQUE: Multidetector CT imaging of the abdomen and pelvis was performed following the standard protocol without IV contrast. COMPARISON:  None. FINDINGS: Lower chest: The lung bases are clear. Hepatobiliary: No focal liver abnormality is seen. No gallstones, gallbladder wall thickening, or biliary dilatation. Pancreas: No ductal dilatation or inflammation. Spleen: Normal in size without focal abnormality. Adrenals/Urinary Tract: Normal adrenal glands. No renal stones or hydronephrosis. No perinephric edema. 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 Both ureters are decompressed without stones along the course. Urinary bladder is nondistended, no bladder stone.  Stomach/Bowel: Stomach is within normal limits. Appendix is normal, for example image 56 series 2. No evidence of bowel wall thickening, distention, or inflammatory changes. Large colonic stool burden in the ascending, transverse, descending and sigmoid colon. There is sigmoid colonic tortuosity. Vascular/Lymphatic: No adenopathy. Unremarkable appearance of noncontrast vascular structures. Reproductive: Prostate is unremarkable. Other: No free air, free fluid, or intra-abdominal fluid collection. No abdominal wall hernia. Musculoskeletal: Multiple degenerative Schmorl's nodes in the lumbar spine. Degenerative change of the left greater than right hip. There are no acute or suspicious osseous abnormalities. IMPRESSION: 1. No renal stones or obstructive uropathy. 2. Moderate stool burden throughout the colon with sigmoid colonic tortuosity, suggesting constipation. Electronically Signed   By: Narda Rutherford M.D.   On: 05/01/2018 05:19    Procedures Procedures (including critical care time)  Medications Ordered in ED Medications  ketorolac (TORADOL) 30 MG/ML injection 15 mg (15 mg Intravenous Given 05/01/18 0544)  acetaminophen (TYLENOL) tablet 1,000 mg (1,000 mg Oral Given 05/01/18 0544)       Final Clinical Impressions(s) / ED Diagnoses   Will start miralax as a bowel regimen.    Return for weakness, numbness, changes in vision or speech, fevers >100.4 unrelieved by medication, shortness of breath, intractable vomiting, or diarrhea, abdominal pain, Inability to tolerate liquids or food, cough, altered mental status or any concerns. No signs of systemic illness or infection. The patient is nontoxic-appearing on exam and vital signs are within normal limits.    I have reviewed the triage vital signs and the nursing notes. Pertinent labs &imaging results that were available during my care of the patient were reviewed by me and considered in my medical decision making (see chart for  details).  After history, exam, and medical workup I feel the patient has been appropriately medically screened and is safe for discharge home. Pertinent diagnoses were discussed with the patient. Patient was given return precautions.   Maurisha Mongeau, MD 05/01/18 1610

## 2018-07-03 ENCOUNTER — Encounter: Payer: Self-pay | Admitting: Internal Medicine

## 2018-07-25 ENCOUNTER — Encounter: Payer: Self-pay | Admitting: Internal Medicine

## 2019-02-03 ENCOUNTER — Encounter: Payer: Self-pay | Admitting: Internal Medicine

## 2019-02-05 ENCOUNTER — Telehealth: Payer: Self-pay | Admitting: *Deleted

## 2019-02-05 DIAGNOSIS — Z20822 Contact with and (suspected) exposure to covid-19: Secondary | ICD-10-CM

## 2019-02-05 NOTE — Telephone Encounter (Signed)
-----   Message from [facility] sent at 02/05/2019  8:55 AM EDT ----- Please test patient on Friday February 09, 2019.

## 2019-02-05 NOTE — Telephone Encounter (Signed)
Spoke with patient.  Scheduled him for COVID 19 test 02/09/2019 at 8 am at [facility].  Testing protocol reviewed.

## 2019-02-06 ENCOUNTER — Encounter: Payer: Self-pay | Admitting: Internal Medicine

## 2019-02-06 NOTE — Telephone Encounter (Signed)
I would recommend to wait until he is negative for COVID

## 2019-02-09 ENCOUNTER — Other Ambulatory Visit: Payer: Self-pay | Admitting: Internal Medicine

## 2019-02-09 ENCOUNTER — Other Ambulatory Visit: Payer: BC Managed Care – PPO

## 2019-02-09 DIAGNOSIS — Z20822 Contact with and (suspected) exposure to covid-19: Secondary | ICD-10-CM

## 2019-02-14 LAB — NOVEL CORONAVIRUS, NAA: SARS-CoV-2, NAA: NOT DETECTED

## 2019-04-17 ENCOUNTER — Ambulatory Visit: Payer: BC Managed Care – PPO | Admitting: Internal Medicine

## 2019-04-17 ENCOUNTER — Encounter: Payer: Self-pay | Admitting: Internal Medicine

## 2019-04-17 ENCOUNTER — Other Ambulatory Visit: Payer: Self-pay

## 2019-04-17 VITALS — BP 128/64 | HR 62 | Temp 98.4°F | Ht 68.2 in | Wt 245.8 lb

## 2019-04-17 DIAGNOSIS — Z0000 Encounter for general adult medical examination without abnormal findings: Secondary | ICD-10-CM | POA: Diagnosis not present

## 2019-04-17 DIAGNOSIS — Z23 Encounter for immunization: Secondary | ICD-10-CM

## 2019-04-17 LAB — POCT URINALYSIS DIPSTICK
Bilirubin, UA: NEGATIVE
Blood, UA: NEGATIVE
Glucose, UA: NEGATIVE
Ketones, UA: NEGATIVE
Leukocytes, UA: NEGATIVE
Nitrite, UA: NEGATIVE
Protein, UA: NEGATIVE
Spec Grav, UA: >=1.03 — AB (ref 1.010–1.025)
Urobilinogen, UA: 0.2 E.U./dL
pH, UA: 6 (ref 5.0–8.0)

## 2019-04-17 NOTE — Progress Notes (Signed)
Subjective:     Patient ID: Alfred Taylor , male    DOB: [birth year] , 43 y.o.   MRN: [id number]   Chief Complaint  Patient presents with  . Annual Exam    HPI  He is here today for a full physical examination.  He has no specific concerns or complaints at this time.    History reviewed. No pertinent past medical history.   Family History  Problem Relation Age of Onset  . Arthritis Mother   . Diabetes Father   . Hypertension Father      Current Outpatient Medications:  Marland Kitchen  Multiple Vitamins-Minerals (MEGA MULTIVITAMIN FOR MEN) TABS, Take 1 tablet by mouth daily., Disp: , Rfl:    No Known Allergies   Review of Systems  Constitutional: Negative.   Respiratory: Negative.   Cardiovascular: Negative.   Gastrointestinal: Negative.   Neurological: Negative.   Psychiatric/Behavioral: Negative.      Today's Vitals   04/17/19 0849  BP: 128/64  Pulse: 62  Temp: 98.4 F (36.9 C)  TempSrc: Oral  SpO2: 98%  Weight: 245 lb 12.8 oz (111.5 kg)  Height: 5' 8.2" (1.732 m)   Body mass index is 37.15 kg/m.   Objective:  Physical Exam Vitals signs and nursing note reviewed.  Constitutional:      Appearance: Normal appearance.  HENT:     Head: Normocephalic and atraumatic.     Right Ear: Tympanic membrane, ear canal and external ear normal.     Left Ear: Tympanic membrane, ear canal and external ear normal.     Nose: Nose normal.     Mouth/Throat:     Mouth: Mucous membranes are moist.     Pharynx: Oropharynx is clear.  Eyes:     Extraocular Movements: Extraocular movements intact.     Conjunctiva/sclera: Conjunctivae normal.     Pupils: Pupils are equal, round, and reactive to light.  Neck:     Musculoskeletal: Normal range of motion and neck supple.  Cardiovascular:     Rate and Rhythm: Normal rate and regular rhythm.     Pulses: Normal pulses.     Heart sounds: Normal heart sounds.  Pulmonary:     Effort: Pulmonary effort is normal.     Breath sounds: Normal  breath sounds.  Chest:     Breasts:        Right: Normal. No swelling, bleeding, inverted nipple, mass or nipple discharge.        Left: Normal. No swelling, bleeding, inverted nipple, mass or nipple discharge.  Abdominal:     General: Abdomen is flat. Bowel sounds are normal.     Palpations: Abdomen is soft.  Genitourinary:    Comments: deferred Musculoskeletal: Normal range of motion.  Skin:    General: Skin is warm.  Neurological:     General: No focal deficit present.     Mental Status: He is alert.  Psychiatric:        Mood and Affect: Mood normal.        Behavior: Behavior normal.         Assessment And Plan:     1. Encounter for annual physical exam  A full exam was performed.  PATIENT HAS BEEN ADVISED TO GET 30-45 MINUTES REGULAR EXERCISE NO LESS THAN FOUR TO FIVE DAYS PER WEEK - BOTH WEIGHTBEARING EXERCISES AND AEROBIC ARE RECOMMENDED.  HE WAS ADVISED TO FOLLOW A HEALTHY DIET WITH AT LEAST SIX FRUITS/VEGGIES PER DAY, DECREASE INTAKE OF RED MEAT, AND TO INCREASE  FISH INTAKE TO TWO DAYS PER WEEK.  MEATS/FISH SHOULD NOT BE FRIED, BAKED OR BROILED IS PREFERABLE.  I SUGGEST WEARING SPF 50 SUNSCREEN ON EXPOSED PARTS AND ESPECIALLY WHEN IN THE DIRECT SUNLIGHT FOR AN EXTENDED PERIOD OF TIME.  PLEASE AVOID FAST FOOD RESTAURANTS AND INCREASE YOUR WATER INTAKE.  - POCT Urinalysis Dipstick (81002)  2. Need for influenza vaccination  - Flu Vaccine QUAD 6+ mos PF IM (Fluarix Quad PF)        Gwynneth Aliment, MD    THE PATIENT IS ENCOURAGED TO PRACTICE SOCIAL DISTANCING DUE TO THE COVID-19 PANDEMIC.

## 2019-04-17 NOTE — Patient Instructions (Signed)

## 2019-04-18 LAB — CBC
Hematocrit: 49.2 % (ref 37.5–51.0)
Hemoglobin: 16.9 g/dL (ref 13.0–17.7)
MCH: 30.5 pg (ref 26.6–33.0)
MCHC: 34.3 g/dL (ref 31.5–35.7)
MCV: 89 fL (ref 79–97)
Platelets: 248 10*3/uL (ref 150–450)
RBC: 5.55 x10E6/uL (ref 4.14–5.80)
RDW: 12.4 % (ref 11.6–15.4)
WBC: 8 10*3/uL (ref 3.4–10.8)

## 2019-04-18 LAB — CMP14+EGFR
ALT: 35 IU/L (ref 0–44)
AST: 39 IU/L (ref 0–40)
Albumin/Globulin Ratio: 1.7 (ref 1.2–2.2)
Albumin: 4.5 g/dL (ref 4.0–5.0)
Alkaline Phosphatase: 55 IU/L (ref 39–117)
BUN/Creatinine Ratio: 8 — ABNORMAL LOW (ref 9–20)
BUN: 10 mg/dL (ref 6–24)
Bilirubin Total: 0.7 mg/dL (ref 0.0–1.2)
CO2: 26 mmol/L (ref 20–29)
Calcium: 9.7 mg/dL (ref 8.7–10.2)
Chloride: 98 mmol/L (ref 96–106)
Creatinine, Ser: 1.21 mg/dL (ref 0.76–1.27)
GFR calc Af Amer: 84 mL/min/{1.73_m2} (ref >59)
GFR calc non Af Amer: 73 mL/min/{1.73_m2} (ref >59)
Globulin, Total: 2.7 g/dL (ref 1.5–4.5)
Glucose: 90 mg/dL (ref 65–99)
Potassium: 4.8 mmol/L (ref 3.5–5.2)
Sodium: 137 mmol/L (ref 134–144)
Total Protein: 7.2 g/dL (ref 6.0–8.5)

## 2019-04-18 LAB — LIPID PANEL
Chol/HDL Ratio: 5.2 ratio — ABNORMAL HIGH (ref 0.0–5.0)
Cholesterol, Total: 235 mg/dL — ABNORMAL HIGH (ref 100–199)
HDL: 45 mg/dL (ref >39)
LDL Chol Calc (NIH): 177 mg/dL — ABNORMAL HIGH (ref 0–99)
Triglycerides: 77 mg/dL (ref 0–149)
VLDL Cholesterol Cal: 13 mg/dL (ref 5–40)

## 2019-04-18 LAB — TSH: TSH: 1.08 u[IU]/mL (ref 0.450–4.500)

## 2019-04-18 LAB — HEMOGLOBIN A1C
Est. average glucose Bld gHb Est-mCnc: 111 mg/dL
Hgb A1c MFr Bld: 5.5 % (ref 4.8–5.6)

## 2019-04-18 LAB — PSA: Prostate Specific Ag, Serum: 0.7 ng/mL (ref 0.0–4.0)

## 2020-04-28 ENCOUNTER — Other Ambulatory Visit: Payer: Self-pay

## 2020-04-28 ENCOUNTER — Encounter: Payer: Self-pay | Admitting: Internal Medicine

## 2020-04-28 ENCOUNTER — Ambulatory Visit (INDEPENDENT_AMBULATORY_CARE_PROVIDER_SITE_OTHER): Payer: 59 | Admitting: Internal Medicine

## 2020-04-28 VITALS — BP 122/68 | HR 56 | Temp 98.1°F | Ht 68.2 in | Wt 203.2 lb

## 2020-04-28 DIAGNOSIS — Z0000 Encounter for general adult medical examination without abnormal findings: Secondary | ICD-10-CM | POA: Diagnosis not present

## 2020-04-28 DIAGNOSIS — E6609 Other obesity due to excess calories: Secondary | ICD-10-CM

## 2020-04-28 DIAGNOSIS — E559 Vitamin D deficiency, unspecified: Secondary | ICD-10-CM

## 2020-04-28 DIAGNOSIS — Z23 Encounter for immunization: Secondary | ICD-10-CM | POA: Diagnosis not present

## 2020-04-28 DIAGNOSIS — R10811 Right upper quadrant abdominal tenderness: Secondary | ICD-10-CM

## 2020-04-28 DIAGNOSIS — Z6830 Body mass index (BMI) 30.0-30.9, adult: Secondary | ICD-10-CM

## 2020-04-28 LAB — POC HEMOCCULT BLD/STL (OFFICE/1-CARD/DIAGNOSTIC)
Card #1 Date: 10042021
Fecal Occult Blood, POC: NEGATIVE

## 2020-04-28 NOTE — Patient Instructions (Signed)

## 2020-04-28 NOTE — Progress Notes (Signed)
I,Tianna Badgett,acting as a Education administrator for Maximino Greenland, MD.,have documented all relevant documentation on the behalf of Maximino Greenland, MD,as directed by  Maximino Greenland, MD while in the presence of Maximino Greenland, MD.  This visit occurred during the SARS-CoV-2 public health emergency.  Safety protocols were in place, including screening questions prior to the visit, additional usage of staff PPE, and extensive cleaning of exam room while observing appropriate contact time as indicated for disinfecting solutions.  Subjective:     Patient ID: Alfred Taylor , male    DOB: Oct 29, 1975 , 44 y.o.   MRN: [id number]   Chief Complaint  Patient presents with  . Annual Exam    HPI  He is here today for a full physical examination.  He has no specific concerns or complaints at this time. He has received the COVID vaccine.     History reviewed. No pertinent past medical history.   Family History  Problem Relation Age of Onset  . Arthritis Mother   . Diabetes Father   . Hypertension Father      Current Outpatient Medications:  Marland Kitchen  Multiple Vitamins-Minerals (MEGA MULTIVITAMIN FOR MEN) TABS, Take 1 tablet by mouth daily., Disp: , Rfl:    No Known Allergies   Men's preventive visit. Patient Health Questionnaire (PHQ-2) is    Office Visit from 04/28/2020 in [facility]  PHQ-2 Total Score 0    . Patient is on a healthy diet. Marital status: Married. Relevant history for alcohol use is:  Social History   Substance and Sexual Activity  Alcohol Use Yes   Comment: occasionally  . Relevant history for tobacco use is:  Social History   Tobacco Use  Smoking Status Never Smoker  Smokeless Tobacco Never Used  .   Review of Systems  Constitutional: Negative.   HENT: Negative.   Eyes: Negative.   Respiratory: Negative.   Cardiovascular: Negative.   Gastrointestinal: Negative.   Endocrine: Negative.   Genitourinary: Negative.   Musculoskeletal: Negative.    Skin: Negative.   Allergic/Immunologic: Negative.   Neurological: Negative.   Hematological: Negative.   Psychiatric/Behavioral: Negative.      Today's Vitals   04/28/20 0902  BP: 122/68  Pulse: (!) 56  Temp: 98.1 F (36.7 C)  TempSrc: Oral  Weight: 203 lb 3.2 oz (92.2 kg)  Height: 5' 8.2" (1.732 m)  PainSc: 0-No pain   Body mass index is 30.72 kg/m.   Objective:  Physical Exam Vitals and nursing note reviewed. Exam conducted with a chaperone present.  Constitutional:      Appearance: Normal appearance.  HENT:     Head: Normocephalic and atraumatic.     Right Ear: Tympanic membrane, ear canal and external ear normal.     Left Ear: Tympanic membrane, ear canal and external ear normal.     Nose: Nose normal.     Mouth/Throat:     Mouth: Mucous membranes are moist.     Pharynx: Oropharynx is clear.  Eyes:     Extraocular Movements: Extraocular movements intact.     Conjunctiva/sclera: Conjunctivae normal.     Pupils: Pupils are equal, round, and reactive to light.  Cardiovascular:     Rate and Rhythm: Normal rate and regular rhythm.     Pulses: Normal pulses.     Heart sounds: Normal heart sounds.  Pulmonary:     Effort: Pulmonary effort is normal.     Breath sounds: Normal breath sounds.  Chest:  Breasts:        Right: Normal. No swelling, bleeding, inverted nipple, mass or nipple discharge.        Left: Normal. No swelling, bleeding, inverted nipple, mass or nipple discharge.  Abdominal:     General: Abdomen is flat. Bowel sounds are normal.     Palpations: Abdomen is soft.     Comments: Tenderness to palpation underneath right ribcage.   Genitourinary:    Prostate: Normal.     Rectum: Normal. Guaiac result negative.  Musculoskeletal:        General: Normal range of motion.     Cervical back: Normal range of motion and neck supple.  Skin:    General: Skin is warm.  Neurological:     General: No focal deficit present.     Mental Status: He is alert.   Psychiatric:        Mood and Affect: Mood normal.        Behavior: Behavior normal.         Assessment And Plan:    1. Encounter for annual physical exam Comments: A full exam was performed. DRE performed, stool is heme negative. PATIENT IS ADVISED TO GET 30-45 MINUTES REGULAR EXERCISE NO LESS THAN FOUR TO FIVE DAYS PER WEEK - BOTH WEIGHTBEARING EXERCISES AND AEROBIC ARE RECOMMENDED.  PATIENT IS ADVISED TO FOLLOW A HEALTHY DIET WITH AT LEAST SIX FRUITS/VEGGIES PER DAY, DECREASE INTAKE OF RED MEAT, AND TO INCREASE FISH INTAKE TO TWO DAYS PER WEEK.  MEATS/FISH SHOULD NOT BE FRIED, BAKED OR BROILED IS PREFERABLE.  I SUGGEST WEARING SPF 50 SUNSCREEN ON EXPOSED PARTS AND ESPECIALLY WHEN IN THE DIRECT SUNLIGHT FOR AN EXTENDED PERIOD OF TIME.  PLEASE AVOID FAST FOOD RESTAURANTS AND INCREASE YOUR WATER INTAKE.  - CBC - CMP14+EGFR - Lipid panel - Hepatitis C antibody - PSA - POC Hemoccult Bld/Stl (1-Cd Office Dx)  2. Right upper quadrant abdominal tenderness without rebound tenderness Comments: His sx appear to be due to musculoskeletal in nature. He was given sample of Salonpas to put on affected area. He will let me know if this provides any relief.  3. Vitamin D deficiency Comments: I will check vitamin D level and supplement as needed.  - VITAMIN D 25 Hydroxy (Vit-D Deficiency, Fractures)  4. Need for influenza vaccination Comments: He was given flu vaccine.  - Flu Vaccine QUAD 6+ mos PF IM (Fluarix Quad PF)  5. Class 1 obesity due to excess calories without serious comorbidity with body mass index (BMI) of 30.0 to 30.9 in adult Comments: He is encouraged to aim for BMI less than 28 to decrease cardiac risk. Encouraged to continue to aim for at least 150 minutes of exercise per week.      Patient was given opportunity to ask questions. Patient verbalized understanding of the plan and was able to repeat key elements of the plan. All questions were answered to their satisfaction.    Maximino Greenland, MD   I, Maximino Greenland, MD, have reviewed all documentation for this visit. The documentation on 04/28/20 for the exam, diagnosis, procedures, and orders are all accurate and complete.  THE PATIENT IS ENCOURAGED TO PRACTICE SOCIAL DISTANCING DUE TO THE COVID-19 PANDEMIC.

## 2020-04-29 LAB — CMP14+EGFR
ALT: 34 IU/L (ref 0–44)
AST: 29 IU/L (ref 0–40)
Albumin/Globulin Ratio: 2.1 (ref 1.2–2.2)
Albumin: 4.2 g/dL (ref 4.0–5.0)
Alkaline Phosphatase: 49 IU/L (ref 44–121)
BUN/Creatinine Ratio: 10 (ref 9–20)
BUN: 10 mg/dL (ref 6–24)
Bilirubin Total: 0.5 mg/dL (ref 0.0–1.2)
CO2: 25 mmol/L (ref 20–29)
Calcium: 9.1 mg/dL (ref 8.7–10.2)
Chloride: 99 mmol/L (ref 96–106)
Creatinine, Ser: 1.03 mg/dL (ref 0.76–1.27)
GFR calc Af Amer: 102 mL/min/{1.73_m2} (ref >59)
GFR calc non Af Amer: 88 mL/min/{1.73_m2} (ref >59)
Globulin, Total: 2 g/dL (ref 1.5–4.5)
Glucose: 100 mg/dL — ABNORMAL HIGH (ref 65–99)
Potassium: 4.4 mmol/L (ref 3.5–5.2)
Sodium: 137 mmol/L (ref 134–144)
Total Protein: 6.2 g/dL (ref 6.0–8.5)

## 2020-04-29 LAB — VITAMIN D 25 HYDROXY (VIT D DEFICIENCY, FRACTURES): Vit D, 25-Hydroxy: 71.3 ng/mL (ref 30.0–100.0)

## 2020-04-29 LAB — CBC
Hematocrit: 43.6 % (ref 37.5–51.0)
Hemoglobin: 14.8 g/dL (ref 13.0–17.7)
MCH: 31.2 pg (ref 26.6–33.0)
MCHC: 33.9 g/dL (ref 31.5–35.7)
MCV: 92 fL (ref 79–97)
Platelets: 198 10*3/uL (ref 150–450)
RBC: 4.74 x10E6/uL (ref 4.14–5.80)
RDW: 12 % (ref 11.6–15.4)
WBC: 6 10*3/uL (ref 3.4–10.8)

## 2020-04-29 LAB — HEPATITIS C ANTIBODY: Hep C Virus Ab: <0.1 s/co ratio (ref 0.0–0.9)

## 2020-04-29 LAB — PSA: Prostate Specific Ag, Serum: 0.7 ng/mL (ref 0.0–4.0)

## 2020-04-29 LAB — LIPID PANEL
Chol/HDL Ratio: 3 ratio (ref 0.0–5.0)
Cholesterol, Total: 188 mg/dL (ref 100–199)
HDL: 63 mg/dL (ref >39)
LDL Chol Calc (NIH): 116 mg/dL — ABNORMAL HIGH (ref 0–99)
Triglycerides: 45 mg/dL (ref 0–149)
VLDL Cholesterol Cal: 9 mg/dL (ref 5–40)

## 2020-09-21 IMAGING — CT CT RENAL STONE PROTOCOL
2 of 4 series · 16 of 46 positions shown, 18 images · non-contrast
Comparison: None.

CLINICAL DATA: Right groin pain.

EXAM:
CT ABDOMEN AND PELVIS WITHOUT CONTRAST
TECHNIQUE: Multidetector CT imaging of the abdomen and pelvis was performed
following the standard protocol without IV contrast.

[Series 2: axial st · axial · 0.76mm/px · z∈[+1104,+1509]mm · 13 of 91 slices shown, 15 images]
[im 5/91  soft-tissue]
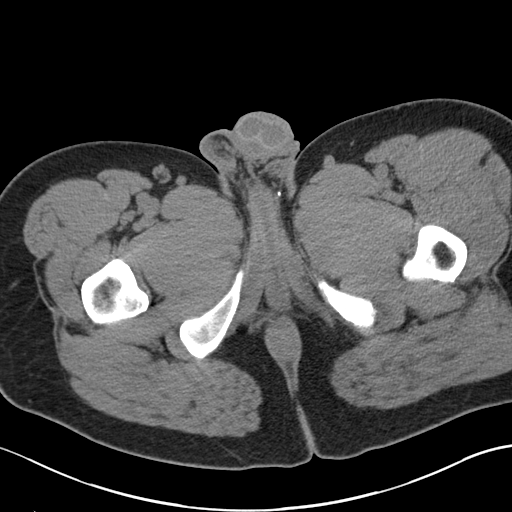
[im 5/91  bone]
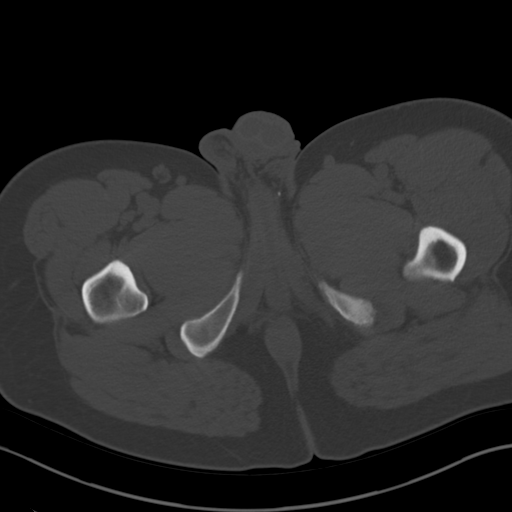
[im 13/91  soft-tissue]
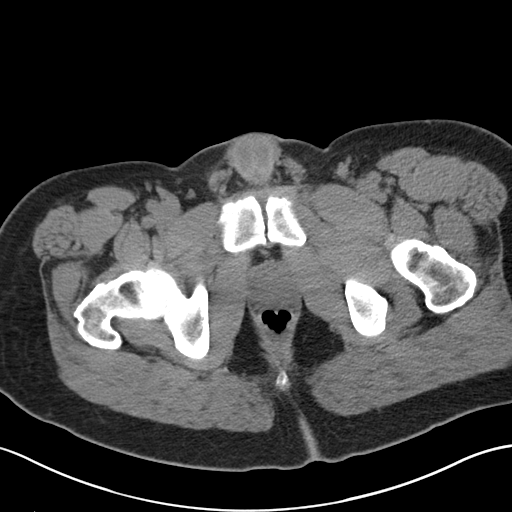
[im 18/91  soft-tissue]
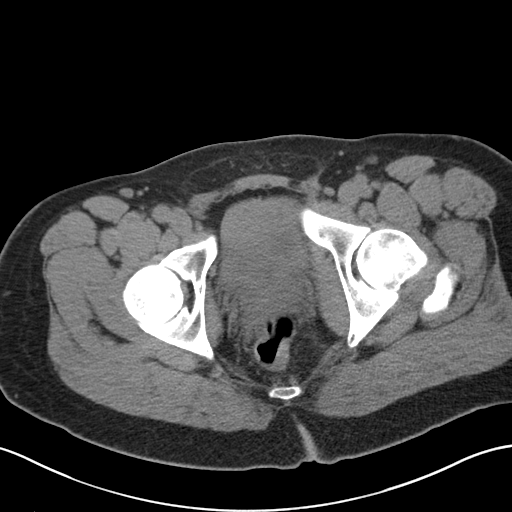
[im 26/91  soft-tissue]
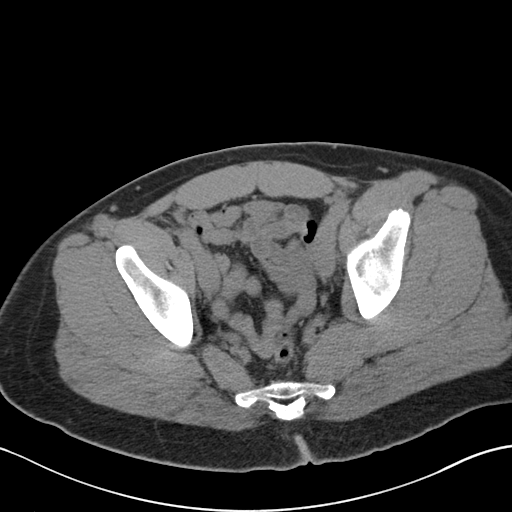
[im 31/91  soft-tissue]
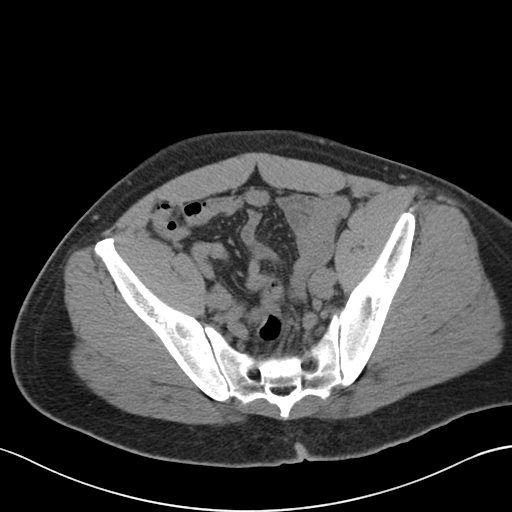
[im 39/91  soft-tissue]
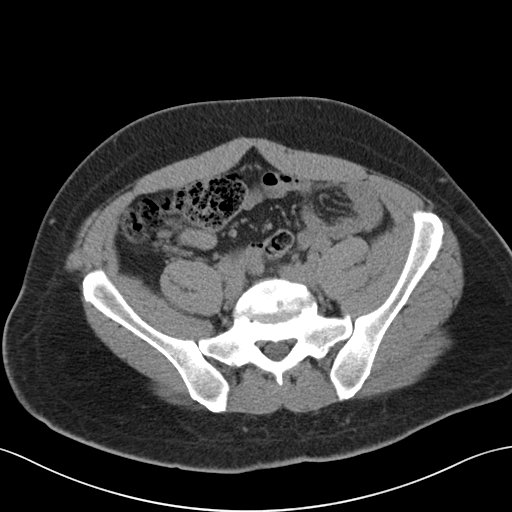
[im 48/91  soft-tissue]
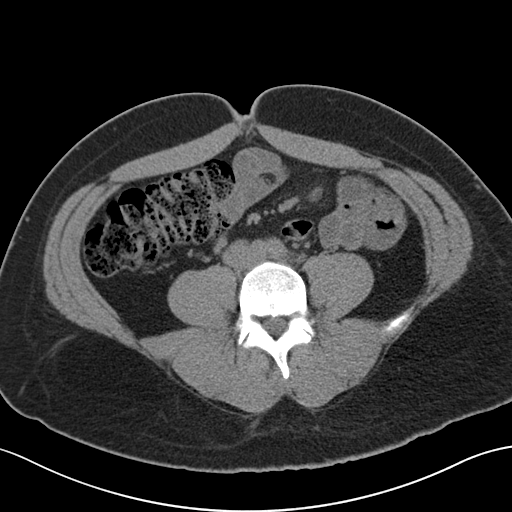
[im 52/91  soft-tissue]
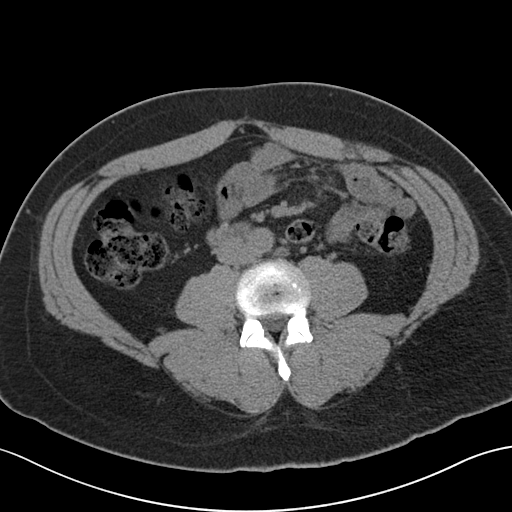
[im 61/91  soft-tissue]
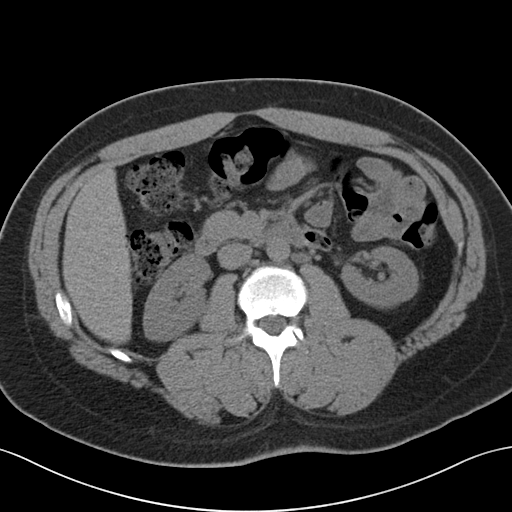
[im 61/91  bone]
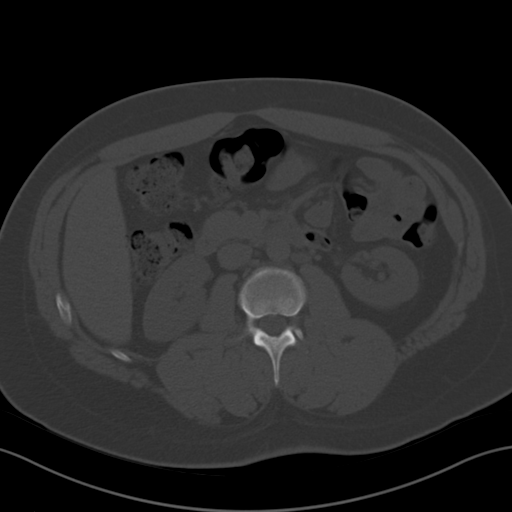
[im 65/91  soft-tissue]
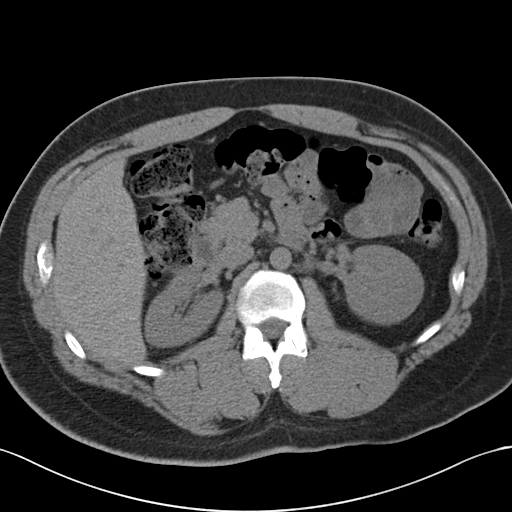
[im 73/91  soft-tissue]
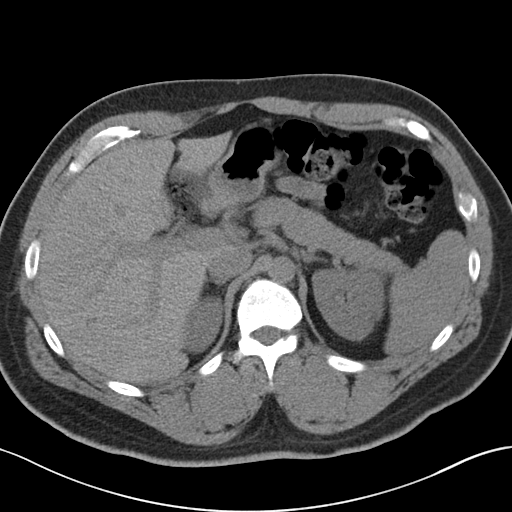
[im 78/91  soft-tissue]
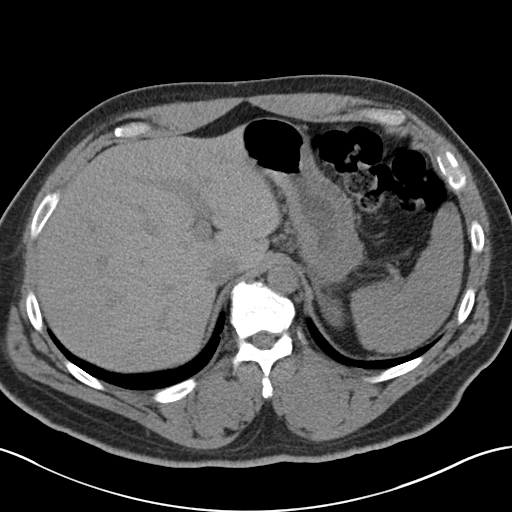
[im 86/91  soft-tissue]
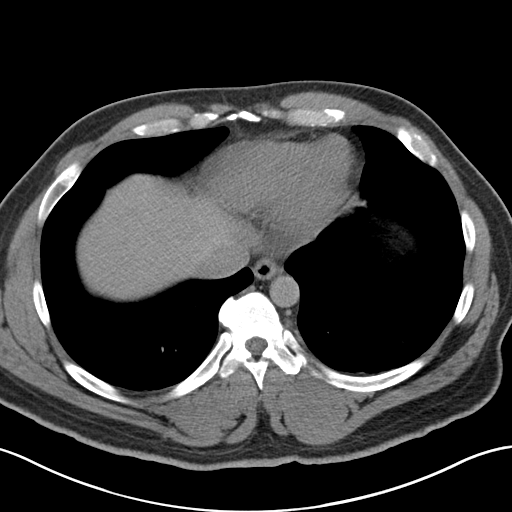

[Series 5: coronal · coronal · 0.73mm/px · 3 of 140 slices shown]
[im 47/140  soft-tissue]
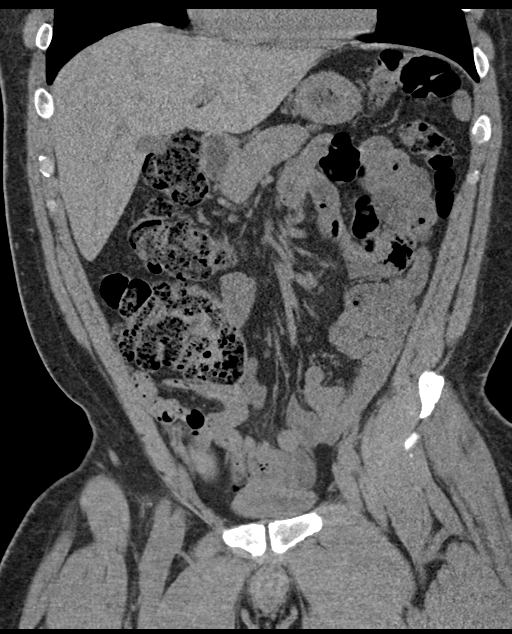
[im 62/140  soft-tissue]
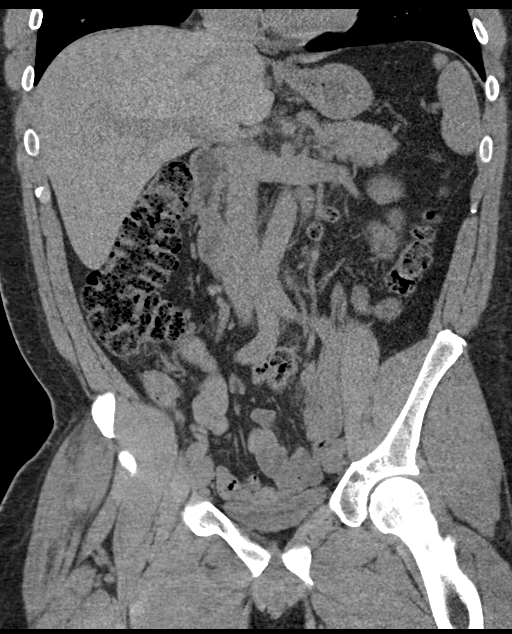
[im 78/140  soft-tissue]
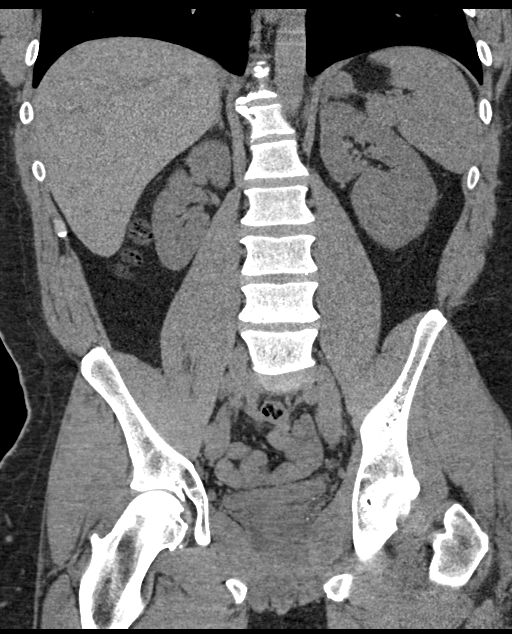

[16 of 46 positions shown; findings below may reference images not displayed]

FINDINGS: Lower chest: The lung bases are clear.

Hepatobiliary: No focal liver abnormality is seen. No gallstones,
gallbladder wall thickening, or biliary dilatation.

Pancreas: No ductal dilatation or inflammation.

Spleen: Normal in size without focal abnormality.

Adrenals/Urinary Tract: Normal adrenal glands. No renal stones or
hydronephrosis. No perinephric edema. Left renal collecting system
is partially duplicated. Both ureters are decompressed without
stones along the course. Urinary bladder is nondistended, no bladder
stone.

Stomach/Bowel: Stomach is within normal limits. Appendix is normal,
for example image 56 series 2. No evidence of bowel wall thickening,
distention, or inflammatory changes. Large colonic stool burden in
the ascending, transverse, descending and sigmoid colon. There is
sigmoid colonic tortuosity.

Vascular/Lymphatic: No adenopathy. Unremarkable appearance of
noncontrast vascular structures.

Reproductive: Prostate is unremarkable.

Other: No free air, free fluid, or intra-abdominal fluid collection.
No abdominal wall hernia.

Musculoskeletal: Multiple degenerative Schmorl's nodes in the lumbar
spine. Degenerative change of the left greater than right hip. There
are no acute or suspicious osseous abnormalities.
IMPRESSION: 1. No renal stones or obstructive uropathy.
2. Moderate stool burden throughout the colon with sigmoid colonic
tortuosity, suggesting constipation.

## 2021-05-05 ENCOUNTER — Encounter: Payer: 59 | Admitting: Internal Medicine

## 2022-08-06 ENCOUNTER — Ambulatory Visit
Admission: RE | Admit: 2022-08-06 | Discharge: 2022-08-06 | Disposition: A | Discharge status: Home / Self Care | Payer: BC Managed Care – PPO | Source: Ambulatory Visit | Attending: Family Medicine | Admitting: Family Medicine

## 2022-08-06 ENCOUNTER — Other Ambulatory Visit: Payer: Self-pay | Admitting: Family Medicine

## 2022-08-06 DIAGNOSIS — M25551 Pain in right hip: Secondary | ICD-10-CM

## 2022-08-06 DIAGNOSIS — R0781 Pleurodynia: Secondary | ICD-10-CM

## 2022-08-06 DIAGNOSIS — M25561 Pain in right knee: Secondary | ICD-10-CM
